# Patient Record
Sex: Male | Born: 2000
Health system: Southern US, Community
[De-identification: ages and names within clinical notes are randomized; demographics above are authoritative.]

## PROBLEM LIST (undated history)

## (undated) DIAGNOSIS — F840 Autistic disorder: Secondary | ICD-10-CM

## (undated) HISTORY — PX: HIP SURGERY: SHX245

---
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
## 2023-03-23 ENCOUNTER — Emergency Department (HOSPITAL_BASED_OUTPATIENT_CLINIC_OR_DEPARTMENT_OTHER)
Admission: EM | Admit: 2023-03-23 | Discharge: 2023-03-23 | Disposition: A | Discharge status: Home / Self Care | Payer: Managed Care, Other (non HMO) | Attending: Emergency Medicine | Admitting: Emergency Medicine

## 2023-03-23 ENCOUNTER — Encounter (HOSPITAL_BASED_OUTPATIENT_CLINIC_OR_DEPARTMENT_OTHER): Payer: Self-pay | Admitting: Emergency Medicine

## 2023-03-23 DIAGNOSIS — R197 Diarrhea, unspecified: Secondary | ICD-10-CM | POA: Diagnosis not present

## 2023-03-23 DIAGNOSIS — R112 Nausea with vomiting, unspecified: Secondary | ICD-10-CM | POA: Diagnosis not present

## 2023-03-23 DIAGNOSIS — R1013 Epigastric pain: Secondary | ICD-10-CM | POA: Diagnosis not present

## 2023-03-23 DIAGNOSIS — R109 Unspecified abdominal pain: Secondary | ICD-10-CM | POA: Diagnosis present

## 2023-03-23 DIAGNOSIS — K529 Noninfective gastroenteritis and colitis, unspecified: Secondary | ICD-10-CM

## 2023-03-23 LAB — COMPREHENSIVE METABOLIC PANEL
ALT: 21 U/L (ref 0–44)
AST: 39 U/L (ref 15–41)
Albumin: 4.4 g/dL (ref 3.5–5.0)
Alkaline Phosphatase: 40 U/L (ref 38–126)
Anion gap: 10 (ref 5–15)
BUN: 15 mg/dL (ref 6–20)
CO2: 26 mmol/L (ref 22–32)
Calcium: 8.6 mg/dL — ABNORMAL LOW (ref 8.9–10.3)
Chloride: 99 mmol/L (ref 98–111)
Creatinine, Ser: 1.17 mg/dL (ref 0.61–1.24)
GFR, Estimated: >60 mL/min (ref >60)
Glucose, Bld: 114 mg/dL — ABNORMAL HIGH (ref 70–99)
Potassium: 3.6 mmol/L (ref 3.5–5.1)
Sodium: 135 mmol/L (ref 135–145)
Total Bilirubin: 1.3 mg/dL — ABNORMAL HIGH (ref 0.3–1.2)
Total Protein: 7.5 g/dL (ref 6.5–8.1)

## 2023-03-23 LAB — URINALYSIS, ROUTINE W REFLEX MICROSCOPIC
Bilirubin Urine: NEGATIVE
Glucose, UA: NEGATIVE mg/dL
Hgb urine dipstick: NEGATIVE
Ketones, ur: NEGATIVE mg/dL
Leukocytes,Ua: NEGATIVE
Nitrite: NEGATIVE
Protein, ur: NEGATIVE mg/dL
Specific Gravity, Urine: >=1.03 (ref 1.005–1.030)
pH: 6 (ref 5.0–8.0)

## 2023-03-23 LAB — CBC
HCT: 45.8 % (ref 39.0–52.0)
Hemoglobin: 15.5 g/dL (ref 13.0–17.0)
MCH: 30.7 pg (ref 26.0–34.0)
MCHC: 33.8 g/dL (ref 30.0–36.0)
MCV: 90.7 fL (ref 80.0–100.0)
Platelets: 195 10*3/uL (ref 150–400)
RBC: 5.05 MIL/uL (ref 4.22–5.81)
RDW: 11.9 % (ref 11.5–15.5)
WBC: 11 10*3/uL — ABNORMAL HIGH (ref 4.0–10.5)
nRBC: 0 % (ref 0.0–0.2)

## 2023-03-23 LAB — LIPASE, BLOOD: Lipase: 30 U/L (ref 11–51)

## 2023-03-23 MED ORDER — KETOROLAC TROMETHAMINE 30 MG/ML IJ SOLN
30.0000 mg | Freq: Once | INTRAMUSCULAR | Status: AC
  Administered 2023-03-23: 30 mg via INTRAVENOUS
  Filled 2023-03-23: qty 1

## 2023-03-23 MED ORDER — ONDANSETRON HCL 4 MG/2ML IJ SOLN
4.0000 mg | Freq: Once | INTRAMUSCULAR | Status: AC
  Administered 2023-03-23: 4 mg via INTRAVENOUS
  Filled 2023-03-23: qty 2

## 2023-03-23 MED ORDER — SODIUM CHLORIDE 0.9 % IV BOLUS
1000.0000 mL | Freq: Once | INTRAVENOUS | Status: AC
  Administered 2023-03-23: 1000 mL via INTRAVENOUS

## 2023-03-23 MED ORDER — ONDANSETRON 8 MG PO TBDP: ORAL_TABLET | ORAL | 0 refills | Status: AC

## 2023-03-23 NOTE — Discharge Instructions (Signed)
Begin taking Zofran as prescribed as needed for nausea.  Clear liquid diet for the next 12 hours, then slowly advance to normal as tolerated.  Return to the emergency department if you develop worsening pain, high fevers, bloody stools, or for other new and concerning symptoms.

## 2023-03-23 NOTE — ED Provider Notes (Signed)
[place] [facility] AT [facility] Provider Note   CSN: [id number] Arrival date & time: 03/23/23  0434     History  Chief Complaint  Patient presents with   Abdominal Pain    Donquez Pioli is a 22 y.o. male.  Patient is a 22 year old male with no significant past medical history.  Patient presenting today with complaints of abdominal pain, nausea, vomiting, and diarrhea.  Symptoms started 3 days ago with episodes of diarrhea, then last night began vomiting repeatedly.  He woke up his mom and had her bring him here.  He is complaining of pain across the upper abdomen that he describes as crampy and severe.  No fevers or chills.  No bloody stool or vomit.  He denies any ill contacts or having consumed any suspicious foods.  The history is provided by the patient.       Home Medications Prior to Admission medications   Medication Sig Start Date End Date Taking? Authorizing Provider  amoxicillin-clavulanate (AUGMENTIN) 875-125 MG tablet Take 1 tablet by mouth every 12 (twelve) hours. 05/21/16   Charlynne Pander, MD      Allergies    Patient has no known allergies.    Review of Systems   Review of Systems  All other systems reviewed and are negative.   Physical Exam Updated Vital Signs Ht 5\' 10"  (1.778 m)   Wt 79.4 kg   BMI 25.11 kg/m  Physical Exam Vitals and nursing note reviewed.  Constitutional:      General: He is not in acute distress.    Appearance: He is well-developed. He is not diaphoretic.  HENT:     Head: Normocephalic and atraumatic.  Cardiovascular:     Rate and Rhythm: Normal rate and regular rhythm.     Heart sounds: No murmur heard.    No friction rub.  Pulmonary:     Effort: Pulmonary effort is normal. No respiratory distress.     Breath sounds: Normal breath sounds. No wheezing or rales.  Abdominal:     General: Bowel sounds are normal. There is no distension.     Palpations: Abdomen is soft.     Tenderness: There is  abdominal tenderness in the epigastric area. There is no right CVA tenderness, left CVA tenderness, guarding or rebound.  Musculoskeletal:        General: Normal range of motion.     Cervical back: Normal range of motion and neck supple.  Skin:    General: Skin is warm and dry.  Neurological:     Mental Status: He is alert and oriented to person, place, and time.     Coordination: Coordination normal.     ED Results / Procedures / Treatments   Labs (all labs ordered are listed, but only abnormal results are displayed) Labs Reviewed  CBC - Abnormal; Notable for the following components:      Result Value   WBC 11.0 (*)    All other components within normal limits  LIPASE, BLOOD  COMPREHENSIVE METABOLIC PANEL  URINALYSIS, ROUTINE W REFLEX MICROSCOPIC    EKG None  Radiology No results found.  Procedures Procedures    Medications Ordered in ED Medications  sodium chloride 0.9 % bolus 1,000 mL (has no administration in time range)  ondansetron (ZOFRAN) injection 4 mg (has no administration in time range)  ketorolac (TORADOL) 30 MG/ML injection 30 mg (has no administration in time range)    ED Course/ Medical Decision Making/ A&P  Patient  is a 22 year old male presenting with complaints of nausea, vomiting, and abdominal cramping for the past 2 days.  Symptoms became worse last night and presents for evaluation of them.  Patient arrives here with stable vital signs and is afebrile.  There is some tenderness to the epigastric region, but no lower abdominal tenderness.  Specifically, there is no right lower quadrant tenderness.  Workup initiated including CBC, CMP, and lipase.  White blood cell count is basically normal at 11,000 and LFTs, electrolytes, and lipase are normal.  Patient has been hydrated with normal saline and given Toradol for cramping.  He is now feeling significantly improved.  At this point, patient has been reexamined and abdomen remains benign.  He has  mild epigastric tenderness, but continues to have no right lower quadrant tenderness.  I highly suspect some sort of viral gastroenteritis or possibly a foodborne illness.  Patient to be discharged with Zofran and ODT, clear liquids, and return as needed if symptoms worsen or change.  Final Clinical Impression(s) / ED Diagnoses Final diagnoses:  None    Rx / DC Orders ED Discharge Orders     None         Geoffery Lyons, MD 03/23/23 [phone]

## 2023-03-23 NOTE — ED Triage Notes (Signed)
PT states diarrhea X 3 day and NVD X 1 day tried OTC meds with some relief. Upper mid abdominal worse with movement, not able to keep much food down but has been drinking Gatorade.
